# Patient Record
Sex: Male | Born: 1940 | Race: White | Hispanic: No | Marital: Married | Smoking: Former smoker
Health system: Southern US, Community
[De-identification: ages and names within clinical notes are randomized; demographics above are authoritative.]

## PROBLEM LIST (undated history)

## (undated) DIAGNOSIS — E785 Hyperlipidemia, unspecified: Secondary | ICD-10-CM

## (undated) DIAGNOSIS — I519 Heart disease, unspecified: Secondary | ICD-10-CM

## (undated) DIAGNOSIS — N400 Enlarged prostate without lower urinary tract symptoms: Secondary | ICD-10-CM

## (undated) DIAGNOSIS — I10 Essential (primary) hypertension: Secondary | ICD-10-CM

## (undated) DIAGNOSIS — I4891 Unspecified atrial fibrillation: Secondary | ICD-10-CM

## (undated) DIAGNOSIS — I2510 Atherosclerotic heart disease of native coronary artery without angina pectoris: Secondary | ICD-10-CM

## (undated) DIAGNOSIS — F32A Depression, unspecified: Secondary | ICD-10-CM

## (undated) DIAGNOSIS — I5022 Chronic systolic (congestive) heart failure: Secondary | ICD-10-CM

## (undated) DIAGNOSIS — I252 Old myocardial infarction: Secondary | ICD-10-CM

## (undated) DIAGNOSIS — J189 Pneumonia, unspecified organism: Secondary | ICD-10-CM

## (undated) DIAGNOSIS — F329 Major depressive disorder, single episode, unspecified: Secondary | ICD-10-CM

## (undated) HISTORY — DX: Pneumonia, unspecified organism: J18.9

## (undated) HISTORY — DX: Unspecified atrial fibrillation: I48.91

## (undated) HISTORY — DX: Old myocardial infarction: I25.2

## (undated) HISTORY — DX: Chronic systolic (congestive) heart failure: I50.22

## (undated) HISTORY — DX: Atherosclerotic heart disease of native coronary artery without angina pectoris: I25.10

## (undated) HISTORY — DX: Benign prostatic hyperplasia without lower urinary tract symptoms: N40.0

## (undated) HISTORY — DX: Depression, unspecified: F32.A

## (undated) HISTORY — DX: Hyperlipidemia, unspecified: E78.5

## (undated) HISTORY — DX: Essential (primary) hypertension: I10

## (undated) HISTORY — DX: Heart disease, unspecified: I51.9

## (undated) HISTORY — DX: Major depressive disorder, single episode, unspecified: F32.9

---

## 1999-06-26 ENCOUNTER — Ambulatory Visit (HOSPITAL_COMMUNITY): Admission: RE | Admit: 1999-06-26 | Discharge: 1999-06-26 | Payer: Self-pay | Admitting: Cardiology

## 1999-06-26 HISTORY — PX: CARDIAC CATHETERIZATION: SHX172

## 2007-07-27 HISTORY — PX: CORONARY ARTERY BYPASS GRAFT: SHX141

## 2007-10-17 HISTORY — PX: CARDIOVASCULAR STRESS TEST: SHX262

## 2007-10-24 HISTORY — PX: US ECHOCARDIOGRAPHY: HXRAD669

## 2007-10-31 ENCOUNTER — Ambulatory Visit (HOSPITAL_COMMUNITY): Admission: RE | Admit: 2007-10-31 | Discharge: 2007-10-31 | Payer: Self-pay | Admitting: Cardiology

## 2007-10-31 HISTORY — PX: CARDIAC CATHETERIZATION: SHX172

## 2007-11-20 ENCOUNTER — Ambulatory Visit: Payer: Self-pay | Admitting: Thoracic Surgery (Cardiothoracic Vascular Surgery)

## 2007-11-27 ENCOUNTER — Ambulatory Visit: Payer: Self-pay | Admitting: Vascular Surgery

## 2007-11-27 ENCOUNTER — Ambulatory Visit (HOSPITAL_COMMUNITY)
Admission: RE | Admit: 2007-11-27 | Discharge: 2007-11-27 | Payer: Self-pay | Admitting: Thoracic Surgery (Cardiothoracic Vascular Surgery)

## 2007-11-29 ENCOUNTER — Encounter: Payer: Self-pay | Admitting: Thoracic Surgery (Cardiothoracic Vascular Surgery)

## 2007-11-29 ENCOUNTER — Ambulatory Visit: Payer: Self-pay | Admitting: Thoracic Surgery (Cardiothoracic Vascular Surgery)

## 2007-11-29 ENCOUNTER — Inpatient Hospital Stay (HOSPITAL_COMMUNITY)
Admission: RE | Admit: 2007-11-29 | Discharge: 2007-12-05 | Payer: Self-pay | Admitting: Thoracic Surgery (Cardiothoracic Vascular Surgery)

## 2007-12-11 ENCOUNTER — Ambulatory Visit: Payer: Self-pay | Admitting: Thoracic Surgery (Cardiothoracic Vascular Surgery)

## 2008-01-01 ENCOUNTER — Encounter
Admission: RE | Admit: 2008-01-01 | Discharge: 2008-01-01 | Payer: Self-pay | Admitting: Thoracic Surgery (Cardiothoracic Vascular Surgery)

## 2008-01-01 ENCOUNTER — Ambulatory Visit: Payer: Self-pay | Admitting: Thoracic Surgery (Cardiothoracic Vascular Surgery)

## 2008-07-02 HISTORY — PX: US ECHOCARDIOGRAPHY: HXRAD669

## 2008-10-28 ENCOUNTER — Encounter: Admission: RE | Admit: 2008-10-28 | Discharge: 2008-10-28 | Payer: Self-pay | Admitting: Family Medicine

## 2008-11-08 ENCOUNTER — Encounter: Admission: RE | Admit: 2008-11-08 | Discharge: 2008-11-08 | Payer: Self-pay | Admitting: Family Medicine

## 2010-06-29 ENCOUNTER — Ambulatory Visit: Payer: Self-pay | Admitting: Cardiology

## 2010-07-03 ENCOUNTER — Ambulatory Visit: Payer: Self-pay | Admitting: Cardiology

## 2010-12-08 ENCOUNTER — Telehealth: Payer: Self-pay | Admitting: Cardiology

## 2010-12-08 NOTE — Telephone Encounter (Signed)
V A PUT HIM ON NEURTON AND ARICEPT. CAN THIS WITH HIS OTHER MEDS  CAUSE MEMORY LOSS.  PATIENT AND WIFE WANTS TO SEE IF HE SHOULD EVEN TAKE IT.  HAS A FAMILY HISTORY OF SEIZURES. CAN ARICEPT CAUSE THIS.

## 2010-12-08 NOTE — Telephone Encounter (Signed)
Wife called stating that he is now on Neurton for anxiety. Is having some memory problems. Put on by [facility] doctor. [facility] wants [facility] Aricept. Wants Dr.Jordan to tell them if OK to start with all of his other meds. Marc Hicks has been diagnosed w/ PTSD. He is to have CT scan; not sch yet. She is afraid to start another medication. Will check w/ Dr. Swaziland Friday when he is in office.

## 2010-12-08 NOTE — Discharge Summary (Signed)
[name], Hicks NO.:  [id number]   MEDICAL RECORD NO.:  [id number]          PATIENT TYPE:  INP   LOCATION:  2027                         FACILITY:  [facility]   PHYSICIAN:  Salvatore Decent. Cornelius Moras, M.D. DATE OF BIRTH:  [birth year]   DATE OF ADMISSION:  11/29/2007  DATE OF DISCHARGE:  12/04/2007                               DISCHARGE SUMMARY   PRIMARY ADMITTING DIAGNOSIS:  Severe three-vessel coronary artery  disease.   ADDITIONAL/DISCHARGE DIAGNOSES:  1. Severe three-vessel coronary artery disease.  2. Status post myocardial infarction in 1994.  3. Hypertension.  4. Hyperlipidemia.  5. Type 2 diabetes mellitus.  6. Benign prostatic hypertrophy.  7. Remote history of tobacco abuse.  8. Postoperative atrial fibrillation.  9. Postoperative acute blood loss anemia.   PROCEDURES PERFORMED:  1. Coronary artery bypass grafting x3 (left internal mammary artery to      the distal LAD, saphenous vein graft to the circumflex marginal,      saphenous vein graft to the posterior descending coronary).  2. Left LAD endarterectomy.  3. Endoscopic vein harvest, right thigh.   HISTORY:  The patient is a 70 year old male with a known history of  coronary artery disease.  He is status post previous myocardial  infarction in 1994, which was treated with angioplasties of the LAD and  a left circumflex at that time.  He has done well until recently, at  which point he developed worsening symptoms of exertional shortness of  breath and fatigue.  He underwent a stress Cardiolite exam which was  abnormal, thus prompting cardiac catheterization.  He underwent elective  catheterization by Dr. Swaziland on October 31, 2007, and was found to have  severe three-vessel coronary artery disease which was not felt to be  amenable to percutaneous intervention.  He was referred to Dr. Tressie Stalker for consideration of surgical revascularization.  Dr. Cornelius Moras reviewed  his films and felt that his best  course of action would be proceed with  CABG at this time.  He explained the risks, benefits, and alternatives  of the surgery to the patient and he agreed to proceed.   HOSPITAL COURSE:  Mr. Hicks was admitted to [facility] on Nov 29, 2007, and underwent CABG x3 and LAD endarterectomy as described in  detail above.  He tolerated the procedure well and was transferred to  the SICU in stable condition.  He was able to be extubated shortly after  surgery.  He was hemodynamically stable and doing well on postop day #1.  His chest tubes and hemodynamic monitoring devices were removed and he  was able to be transferred to the Step-Down Unit.  Postoperatively, he  has done very well.  He did have one brief episode of atrial  fibrillation on postop day #3 and was started on IV amiodarone.  He  converted quickly to normal sinus rhythm and has remained in sinus since  that time.  He has subsequently been switched to p.o. amiodarone and is  tolerating this well.  He has been restarted on a beta-blocker and an  ACE inhibitor.  He has also been started on Plavix and low-dose aspirin.  Overall, he is doing well.  He has been volume overloaded and was  started on Lasix to which he is responding well.  He remains  approximately 4 kg above his preoperative weight with some mild lower  extremity edema on physical exam.  His incisions are all healing well.  He is ambulating in the hall for cardiac rehab phase 1 and is making  good progress.  He has had no further arrhythmias.  He is tolerating a  regular diet.  His most recent labs show hemoglobin 11.6, hematocrit  32.7, platelets 187, white count 12.2, sodium 137, potassium 4.1, BUN  27, creatinine 0.96.  It is felt that since he has remained stable and  is doing well, he may be discharged home at this time.   DISCHARGE MEDICATIONS ARE AS FOLLOWS:  1. Enteric-coated aspirin 81 mg daily.  2. Plavix 75 mg daily.  3. Coreg 12.5 mg b.i.d.  4.  Lisinopril 20 mg daily.  5. Amiodarone 200 mg b.i.d.  6. Ultram 50-100 mg q.4-6 h. p.r.n. for pain.  7. Lasix 40 mg daily x4 days.  8. Potassium 20 mEq daily x4 days.  9. Novolin 70/30 45 units q.a.m. and 50 units q.p.m.  10.Metformin 150 mg b.i.d.  11.Cod liver oil b.i.d.  12.Niaspan 750 mg 2 tablets nightly.  13.Multivitamin daily.  14.B complex vitamin daily.   ALLERGIES:  C 1000 mg daily.   DISCHARGE INSTRUCTIONS:  He is asked to refrain from driving, heavy  lifting, or strenuous activity.  He may continue ambulating daily and  using his incentive spirometer.  He may shower daily and clean his  incisions with soap and water.  He will continue a low-fat, low-sodium,  carbohydrate-modified, medium-calorie diet.   DISCHARGE FOLLOW-UP:  He will need to make an appointment to see Dr.  Swaziland in 2 weeks.  He will be called with an appointment to see Dr.  Cornelius Moras in 3 weeks.  He will be contacted by the [facility] office with this  appointment.  He will also have a chest x-ray at [facility]  prior to his appointment with Dr. Cornelius Moras.  In the interim, he is asked to  call if he experiences any problems or has questions.      Coral Ceo, P.A.      Salvatore Decent. Cornelius Moras, M.D.  Electronically Signed    GC/MEDQ  D:  12/04/2007  T:  12/04/2007  Job:  [id number]   cc:   Peter M. Swaziland, M.D.  Robert A. Nicholos Johns, M.D.
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
## 2010-12-08 NOTE — Op Note (Signed)
Marc Hicks, DEPASCALE NO.:  [id number]   MEDICAL RECORD NO.:  [id number]          PATIENT TYPE:  INP   LOCATION:  2309                         FACILITY:  [facility]   PHYSICIAN:  Salvatore Decent. Cornelius Moras, M.D. DATE OF BIRTH:  1941/05/21   DATE OF PROCEDURE:  11/29/2007  DATE OF DISCHARGE:                               OPERATIVE REPORT   PREOPERATIVE DIAGNOSIS:  Severe three-vessel coronary artery disease.   POSTOPERATIVE DIAGNOSIS:  Severe three-vessel coronary artery disease.   PROCEDURE:  Median sternotomy for coronary artery bypass grafting x3  with coronary artery endarterectomy (left internal mammary artery to  distal left anterior descending coronary artery with endarterectomy,  saphenous vein graft to circumflex marginal branch, saphenous vein graft  to posterior descending coronary artery, and endoscopic saphenous vein  harvest from right thigh).   SURGEON:  Salvatore Decent. Cornelius Moras, MD   ASSISTANT:  Rowe Clack, [facility]   ANESTHESIA:  General.   BRIEF CLINICAL NOTE:  The patient is a 70 year old male with known  history of coronary artery disease, hypertension, hyperlipidemia, and  type 2 diabetes mellitus.  The patient suffered an acute anterior wall  myocardial infarction in 1994.  He was treated with emergency  angioplasty of the left anterior descending coronary artery at that  time.  He subsequently underwent angioplasty of the left circumflex  coronary artery later that week.  Since then, he has done well until  recently.  The patient now presents with worsening symptoms of  exertional shortness of breath and fatigue.  Stress Cardiolite exam was  abnormal, prompting cardiac catheterization.  This was performed by Dr.  Swaziland on October 31, 2007.  Findings were notable for the presence of  severe three-vessel coronary artery disease.  A full consultation note  has been dictated previously.   The patient and his wife have been counseled regarding the indications,  risks, and potential benefits of surgery.  Alternative treatment  strategies have been discussed.  They understand and accept all  associated risks and desire to proceed with surgery as described.   OPERATIVE FINDINGS:  1. Moderate left ventricular dysfunction.  2. Good-quality left internal mammary artery and saphenous vein      conduit for grafting.  3. Diffuse coronary artery disease.  4. Left anterior descending coronary artery very diffusely diseased,      requiring coronary artery endarterectomy.  5. Dense adhesions around the anterior wall and apex of the heart      consistent with remote history of transmural myocardial infarction.   OPERATIVE NOTE DETAIL:  The patient was brought to the operating room on  the above-mentioned date and central monitoring was established by the  Anesthesia Service under the care and direction of Dr. Arta Bruce.  Specifically, a Swan-Ganz catheter was placed through the right internal  jugular approach.  A radial arterial line was placed.  Intravenous  antibiotics were administered.  Following induction with general  endotracheal anesthesia, a Foley catheter was placed.  The patient's  chest, abdomen, both groins, and both lower extremities were prepared  and draped in sterile manner.  Baseline transesophageal echocardiogram  was performed by Dr. Michelle Piper.  This demonstrates moderate left ventricular  dysfunction.  There was trivial mitral regurgitation.  No other  abnormalities were noted.   Median sternotomy incision was performed and left internal mammary  artery was dissected from the chest wall and prepared for bypass  grafting.  Left internal mammary artery was good-quality conduit.  Simultaneously, saphenous vein was obtained from the patient's right  thigh using endoscopic vein harvest technique.  The saphenous vein was  slightly large caliber but otherwise good-quality conduit.  The patient  was heparinized systemically.  After the  saphenous vein has been removed  from the left lower extremity,  the small incisions were closed with  running absorbable suture.  The left internal mammary artery was  transected distally and noted to have adequate flow.   The pericardium was opened.  The heart was quite enlarged and the aorta  was somewhat foreshortened.  The ascending aorta was otherwise normal in  appearance.  The ascending aorta and the right atrium were cannulated  for cardiopulmonary bypass.  Cardiopulmonary bypass was begun.  There  were dense adhesions between the visceral and parietal surface of the  pericardium in the vicinity of the distal anterior wall and apex of the  heart.  These were divided using sharp dissection to mobilize the heart  completely.  The epicardial surface of the heart has a thick layer of  fat and all coronary arteries are intramyocardial.  A temperature probe  was placed in the left ventricular septum and a cardioplegic catheter  was placed in the ascending aorta.   The patient was allowed to cool passively to 32 degrees systemic  temperature.  The aortic crossclamp was applied and cold blood  cardioplegia was administered initially in the antegrade fashion through  the aortic root.  Iced saline slush was applied for topical hypothermia.  The initial cardioplegic arrest and myocardial cooling was felt to be  excellent.  Repeat doses of cardioplegia were administered  intermittently throughout the crossclamp portion of the operation  through the aortic root and down subsequently placed vein graft to  maintain left ventricular septal temperature below 15 degrees  centigrade.   The following distal coronary anastomoses were performed:  1. The posterior descending coronary artery was grafted with a      saphenous vein graft in end-to-side fashion.  This vessel measured      1.5 mm in diameter and is a fair-to-good quality target vessel.  2. The circumflex marginal branch was grafted with  a saphenous vein      graft in end-to-side fashion.  This vessel measures 2.0 mm in      diameter and is a fair-to-good quality target vessel for grafting.  3. The distal left anterior descending coronary artery was grafted      with left internal mammary artery in end-to-side fashion.  This      vessel was diffusely diseased and a poor quality target vessel for      grafting.  Endarterectomy of the coronary artery is required to      facilitate construction of the distal anastomoses.  Endarterectomy      was performed using the standard eversion technique and only a      short segment was required along the distal end of the vessel  and      the endarterectomy specimen feathers nicely.   Both proximal saphenous vein anastomoses were performed directly to the  ascending aorta prior  to removal of the aortic crossclamp.  Left  ventricular septal temperature rises appropriately following reperfusion  of the left internal mammary artery.  The aortic crossclamp was removed  after a total crossclamp time of 81 minutes.  The heart began to beat  spontaneously without need for cardioversion.  All proximal and distal  coronary anastomoses were inspected for hemostasis and appropriate graft  orientation.  Epicardial pacing wires were fixed to the right  ventricular outflow tract into the right atrial appendage.  The patient  was rewarmed to 37 degrees centigrade temperature.  The patient weaned  from cardiopulmonary bypass without difficulty.  The patient's rhythm at  separation from bypass is slow junctional escape rhythm.  AV sequential  pacing is employed.  Total cardiopulmonary bypass time to the operation  99 minutes.  The patient weaned from bypass on dopamine at 3  mcg/kg/minute.  Followup transesophageal echocardiogram performed by Dr.  Michelle Piper after separation from bypass, demonstrates no significant changes  from preoperatively.   The venous and arterial cannulae were removed  uneventfully.  Protamine  was administered to reverse the anticoagulation.  Mediastinum and left  chest are irrigated with saline solution containing vancomycin.  Meticulous surgical hemostasis ascertained.  The mediastinum and left  chest are drained using three chest tubes exited through separate stab  incisions inferiorly.  Pericardium and soft tissues anterior to the  aorta were reapproximated loosely.  The sternum was closed with double-  strength sternal wire.  The soft tissues anterior to the sternum were  closed in multiple layers and the skin was closed with a running  subcuticular skin closure.   The patient tolerated the procedure well and was transported to the  surgical intensive care unit in stable condition.  There were no  intraoperative complications.  All sponge, instrument, and needle counts  were verified, correct at the completion of the operation.  No blood  products were administered.      Salvatore Decent. Cornelius Moras, M.D.  Electronically Signed     CHO/MEDQ  D:  11/29/2007  T:  11/30/2007  Job:  [id number]   cc:   Peter M. Swaziland, M.D.  Robert A. Nicholos Johns, M.D.  Dr. Andee Lineman
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
## 2010-12-08 NOTE — Assessment & Plan Note (Signed)
OFFICE VISIT   TRASK, VOSLER  DOB:  1940/12/20                                        Dec 11, 2007  CHART #:  [id number]   HISTORY OF PRESENT ILLNESS:  Mr. Pellegrin returns to the office as an add-  on visit following recent coronary artery bypass grafting.  He was  discharged from hospital last week.  Since then, he has been doing quite  well.  However, his wife called the office on Friday and again this  morning due to concern regarding some oozing from an IV site on his  right forearm.  He has not had any fevers or chills.  He remains on  Plavix.  He has had some bloody drainage from the right forearm IV site.  They are concerned about his chest tube incisions.  Appetite is good.  He has had minimal pain.  He denies any shortness of breath.  His  activity level is slowly improving.  His weight has continued to  gradually trend down and is now below his baseline preoperative weight.  Overall, he feels well.  Medications remain unchanged from the time of  hospital discharge.   PHYSICAL EXAMINATION:  Was notable for well-appearing male.  Blood  pressure 133/70, pulse 76, oxygen saturation 98% on room air.  He is  afebrile.  His sternal incision is healing nicely.  The sternum is  stable on palpation.  Breath sounds are clear to auscultation and  symmetrical bilaterally.  The chest tube incisions are clean, and  although two of the skin edges are somewhat open, the wounds are healing  appropriately, and there is no sign of significant infection.  The right  lower extremity endoscopic vein harvest incision is healing nicely.  There is no lower extremity edema.  There is an old IV site in the right  forearm.  There is no surrounding erythema or cellulitis.  This  apparently has been oozing.  The patient remains on Plavix.   IMPRESSION:  Mr. Wingert appears to be doing well.   PLAN:  I have advised Mr. Schueller to keep the dressing applied snugly to  the  right forearm vein site, and to keep the arm propped up in the air  as much as possible to keep the pressure off it.  He will otherwise  continue with his routine postoperative convalescence as previously  instructed.  We have not made any changes to his current medications.  We will plan  to see him back for further followup in 3 weeks.   Salvatore Decent. Cornelius Moras, M.D.  Electronically Signed   CHO/MEDQ  D:  12/11/2007  T:  12/11/2007  Job:  [id number]   cc:   Peter M. [place]aziland, M.D.  Robert A. Nicholos Johns, M.D.

## 2010-12-08 NOTE — Assessment & Plan Note (Signed)
OFFICE VISIT   ORSON, RHO  DOB:  1941/03/07                                        January 01, 2008  CHART #:  [id number]   HISTORY OF PRESENT ILLNESS:  Mr. Dikes returns for followup, status  post coronary artery bypass grafting x3 on Nov 29, 2007.  Findings at the  time of surgery were notable for diffuse coronary artery disease,  requiring coronary endarterectomy for his left anterior descending  coronary artery.  Postoperative recovery was relatively unremarkable,  although the patient did have transient episode of postoperative atrial  fibrillation for which he was treated with amiodarone.  He was  discharged home in normal sinus rhythm.  He was kept on Plavix at least  for the short term due to the need for coronary endarterectomy at the  time of surgery.  Since hospital discharge, Mr. Flessner has continued to  do well.  He was last seen here in the office on Dec 11, 2007.  Since  then, he has had no further problems or complaints.  He has been seen in  followup by Dr. Peter Swaziland, and his dose of amiodarone was cut back to  200 mg daily.  On return to our office today, Mr. Macqueen reports  feeling well.  He has minimal residual soreness in his chest.  He has no  shortness of breath.  He denies any exertional chest tightness.  His  activity level continues to improve.  He has already started driving an  automobile on his own.  He is walking every day at [facility] and trying to  increase his activity.  He has decided not to take part in the cardiac  rehab program due to the need for an associated co-pay with his  insurance.  Overall, he has no complaints.   PHYSICAL EXAMINATION:  Notable for a well-appearing male with blood  pressure 115/66, pulse 72, and oxygen saturation 98% on room air.  CHEST:  Notable for median sternotomy incision that is healed nicely.  The sternum is stable on palpation.  Breath sounds are clear to  auscultation and symmetrical  bilaterally.  No wheezes or rhonchi noted.  CARDIOVASCULAR:  Regular rate and rhythm.  No murmurs, rubs, or gallops  are noted.  ABDOMEN:  Soft and nontender.  EXTREMITIES:  Warm and well-perfused.  The small incision from  endoscopic vein harvest on the right lower leg is healing well.  There  is no lower extremity edema.  The remainder of his physical exam is  unrevealing.   DIAGNOSTIC TEST:  Chest x-ray obtained today at the [facility] is reviewed.  This demonstrates clear lung fields bilaterally.  There are no pleural effusions.  All the sternal wires appear intact.   IMPRESSION:  Mr. Selvidge appears to be progressing quite well.   PLAN:  I have encouraged Mr. Candelaria to continue gradually  to increase  his physical activity as tolerated with his only limitation at this  point remaining that he refrain from heavy lifting or strenuous use of  his arms or shoulders for at least another 2 months.  I have encouraged  him to start working on his own physical rehab, as he has decided not to  take part in formal cardiac rehab program.  I have reminded him to  refrain from any heavy  lifting or strenuous use of his arms or shoulders  for at least another 2 months.  I do think it is reasonable for him to  resume driving an automobile.  I suggested that he could probably stop  amiodarone completely when his current prescription expires.  He is  concerned about the cost associated with Plavix therapy, and I have  suggested that perhaps after 3 months, he could cut back to full  strength aspirin daily rather than staying on Plavix.  I would like him  to stay on Plavix for the first 3 months due to his coronary  endarterectomy.  All of his questions have been addressed.  In the  future, Mr. Molesworth will call and return to see [place] here at [facility] only if further problems or difficulties arise.   Salvatore Decent. Cornelius Moras, M.D.  Electronically Signed    CHO/MEDQ  D:  01/01/2008  T:  01/02/2008  Job:  [id number]   cc:   Peter M. [place], M.D.  Robert A. Nicholos Johns, M.D.  Raye Sorrow, DO

## 2010-12-11 ENCOUNTER — Telehealth: Payer: Self-pay | Admitting: *Deleted

## 2010-12-11 NOTE — Telephone Encounter (Signed)
Per Dr.Jordan advised that from cardiac standpoint would be OK to start Aricept. Advised wife.

## 2010-12-11 NOTE — Cardiovascular Report (Signed)
Marc Hicks, GEER NO.:  [id number]   MEDICAL RECORD NO.:  [id number]          PATIENT TYPE:  OIB   LOCATION:  2858                         FACILITY:  [facility]   PHYSICIAN:  Peter M. Swaziland, M.D.  DATE OF BIRTH:  1941/02/26   DATE OF PROCEDURE:  DATE OF DISCHARGE:  10/31/2007                            CARDIAC CATHETERIZATION   INDICATIONS FOR PROCEDURE:  The patient is a 70 year old white male with  history of diabetes mellitus and known history of coronary disease.  He  is status post acute anterior myocardial infarction in 1994.  He  presents with symptoms of fatigue and dyspnea.  His recent stress  Cardiolite study showed a significant reduction in his exercise  tolerance and ejection fraction.  He had a fixed anterior and apical  defect.   PROCEDURES:  1. Left heart catheterization.  2. Coronary and left ventricular angiography.   EQUIPMENTS USED:  A 5-French 4 cm left Judkins catheter, 5-French 4 cm  right Judkins catheter, 5-French pigtail catheter, and 5-French arterial  sheath.   MEDICATIONS:  1. Local anesthesia with 1% lidocaine.  2. Contrast 100 mL of Omnipaque.   HEMODYNAMIC DATA:  Aortic pressure was 128/58 with a mean of 87 mmHg,  left ventricle pressure was 124 with EDP of 16 mmHg.   ANGIOGRAPHIC DATA:  Left coronary artery arises and distributes  normally.  The left main coronary artery appears normal.   The left anterior descending artery has a 50% ostial stenosis.  There is  a diffuse 30% narrowing in the proximal vessel and the mid vessel.  At  the takeoff of the first diagonal branch, there is a bifurcation  stenosis of 90%.  Following this in the mid vessel, there is a segment  of diffuse disease up to 70%-80%.   There is a modest-sized intermediate branch which has a 50%-60% stenosis  at its origin.   The left circumflex coronary artery has 30%-40% narrowing proximally.  In the midvessel, there is an eccentric 40%-50% stenosis  before large  marginal vessel.   The right coronary artery arises and distributes normally.  It is a  dominant vessel.  There is a 70%-80% stenosis proximally.  There is then  an eccentric 50%-60% stenosis in the midvessel.  There is 30% disease  prior to the PDA.   Left ventricular angiography was performed in the RAO view.  This  demonstrates severe hypokinesia of the mid-to-distal anterior wall and  the apex is akinetic.  Overall, left ventricular systolic function is  moderately reduced with ejection fraction estimated at 40%-45%.  There  is mild mitral insufficiency noted.   FINAL INTERPRETATION:  1. Three-vessel obstructive atherosclerotic coronary artery disease.  2. Moderate left ventricle dysfunction.           ______________________________  Peter M. Swaziland, M.D.     PMJ/MEDQ  D:  11/01/2007  T:  11/02/2007  Job:  [id number]

## 2011-03-18 ENCOUNTER — Encounter: Payer: Self-pay | Admitting: Cardiology

## 2011-03-22 ENCOUNTER — Encounter: Payer: Self-pay | Admitting: Cardiology

## 2011-04-02 ENCOUNTER — Encounter: Payer: Self-pay | Admitting: Cardiology

## 2011-04-02 ENCOUNTER — Ambulatory Visit (INDEPENDENT_AMBULATORY_CARE_PROVIDER_SITE_OTHER): Payer: Medicare Other | Admitting: Cardiology

## 2011-04-02 DIAGNOSIS — E785 Hyperlipidemia, unspecified: Secondary | ICD-10-CM

## 2011-04-02 DIAGNOSIS — E119 Type 2 diabetes mellitus without complications: Secondary | ICD-10-CM

## 2011-04-02 DIAGNOSIS — I10 Essential (primary) hypertension: Secondary | ICD-10-CM

## 2011-04-02 DIAGNOSIS — I519 Heart disease, unspecified: Secondary | ICD-10-CM

## 2011-04-02 DIAGNOSIS — I252 Old myocardial infarction: Secondary | ICD-10-CM

## 2011-04-02 DIAGNOSIS — I2510 Atherosclerotic heart disease of native coronary artery without angina pectoris: Secondary | ICD-10-CM

## 2011-04-02 NOTE — Patient Instructions (Addendum)
You need to lose weight!   Increase your aerobic activity.  Continue your current medications.  I will see you again in 6 months.

## 2011-04-03 DIAGNOSIS — I252 Old myocardial infarction: Secondary | ICD-10-CM | POA: Insufficient documentation

## 2011-04-03 DIAGNOSIS — I10 Essential (primary) hypertension: Secondary | ICD-10-CM | POA: Insufficient documentation

## 2011-04-03 DIAGNOSIS — I2510 Atherosclerotic heart disease of native coronary artery without angina pectoris: Secondary | ICD-10-CM | POA: Insufficient documentation

## 2011-04-03 DIAGNOSIS — Z794 Long term (current) use of insulin: Secondary | ICD-10-CM | POA: Insufficient documentation

## 2011-04-03 DIAGNOSIS — E119 Type 2 diabetes mellitus without complications: Secondary | ICD-10-CM | POA: Insufficient documentation

## 2011-04-03 DIAGNOSIS — E785 Hyperlipidemia, unspecified: Secondary | ICD-10-CM | POA: Insufficient documentation

## 2011-04-03 DIAGNOSIS — I519 Heart disease, unspecified: Secondary | ICD-10-CM | POA: Insufficient documentation

## 2011-04-03 NOTE — Assessment & Plan Note (Signed)
He is having no significant anginal symptoms. He is 3 years out from his bypass surgery. I would anticipate a followup stress test at your 5 unless he develops symptoms. I stressed the importance of lifestyle modifications with increased aerobic activity and dietary modification.

## 2011-04-03 NOTE — Assessment & Plan Note (Signed)
He is intolerant to multiple lipid-lowering agents. Dietary modification will be crucial.

## 2011-04-03 NOTE — Assessment & Plan Note (Signed)
Blood pressure is mildly elevated for his goal of 130 systolic. I think he needs to make significant improvements in his diet and aerobic activity. I think lifestyle modification would go a long way to helping his situation but he appears to be poorly motivated.
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
## 2011-04-03 NOTE — Assessment & Plan Note (Signed)
He has moderate left ventricle dysfunction. He has some slight edema today. Have instructed him on the importance of sodium restriction. We will continue with his ACE inhibitor and carvedilol.

## 2011-04-20 LAB — APTT: aPTT: 46 — ABNORMAL HIGH

## 2011-10-07 ENCOUNTER — Ambulatory Visit (INDEPENDENT_AMBULATORY_CARE_PROVIDER_SITE_OTHER): Payer: Medicare Other | Admitting: Cardiology

## 2011-10-07 ENCOUNTER — Encounter: Payer: Self-pay | Admitting: Cardiology

## 2011-10-07 VITALS — BP 140/68 | HR 85 | Ht 72.0 in | Wt 255.0 lb

## 2011-10-07 DIAGNOSIS — I519 Heart disease, unspecified: Secondary | ICD-10-CM

## 2011-10-07 DIAGNOSIS — E7800 Pure hypercholesterolemia, unspecified: Secondary | ICD-10-CM

## 2011-10-07 DIAGNOSIS — E785 Hyperlipidemia, unspecified: Secondary | ICD-10-CM

## 2011-10-07 DIAGNOSIS — I10 Essential (primary) hypertension: Secondary | ICD-10-CM

## 2011-10-07 DIAGNOSIS — I2510 Atherosclerotic heart disease of native coronary artery without angina pectoris: Secondary | ICD-10-CM

## 2011-10-07 NOTE — Assessment & Plan Note (Signed)
He is on chronic therapy with an ACE inhibitor and carvedilol. He appears to be euvolemic today.

## 2011-10-07 NOTE — Patient Instructions (Signed)
Continue your current medication  Keep up the weight loss.  Exercise more.  I will see you again in 6 months.

## 2011-10-07 NOTE — Assessment & Plan Note (Signed)
He has no significant anginal symptoms. I encouraged him to increase his aerobic activity. We will continue with risk factor modification.

## 2011-10-07 NOTE — Assessment & Plan Note (Signed)
He is intolerant to multiple lipid-lowering agents. He is currently taking fish oil. He needs to focus on weight loss.
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
## 2011-10-07 NOTE — Assessment & Plan Note (Signed)
Blood pressure control is acceptable. 

## 2013-02-22 ENCOUNTER — Encounter: Payer: Self-pay | Admitting: Cardiology

## 2013-02-23 ENCOUNTER — Encounter: Payer: Self-pay | Admitting: Cardiology

## 2015-04-09 ENCOUNTER — Ambulatory Visit (HOSPITAL_COMMUNITY)
Admission: RE | Admit: 2015-04-09 | Discharge: 2015-04-09 | Disposition: A | Discharge status: Home / Self Care | Payer: Medicare Other | Source: Ambulatory Visit | Attending: Nurse Practitioner | Admitting: Nurse Practitioner

## 2015-04-09 VITALS — BP 106/56 | HR 76 | Ht 72.0 in | Wt 250.8 lb

## 2015-04-09 DIAGNOSIS — I2510 Atherosclerotic heart disease of native coronary artery without angina pectoris: Secondary | ICD-10-CM | POA: Diagnosis not present

## 2015-04-09 DIAGNOSIS — I480 Paroxysmal atrial fibrillation: Secondary | ICD-10-CM | POA: Diagnosis present

## 2015-04-09 DIAGNOSIS — Z87890 Personal history of sex reassignment: Secondary | ICD-10-CM | POA: Diagnosis not present

## 2015-04-09 DIAGNOSIS — Z951 Presence of aortocoronary bypass graft: Secondary | ICD-10-CM | POA: Insufficient documentation

## 2015-04-09 NOTE — Patient Instructions (Signed)
Your physician has requested that you have an echocardiogram. Echocardiography is a painless test that uses sound waves to create images of your heart. It provides your doctor with information about the size and shape of your heart and how well your heart's chambers and valves are working. This procedure takes approximately one hour. There are no restrictions for this procedure.  Marc Hicks will call you regarding appointment for echo. Scheduler will be in touch with you regarding appt with Dr. Swaziland.  Your physician has recommended you make the following change in your medication:  1)Stop aspirin 2)may use tylenol for pain.  Parking code for October 0900

## 2015-04-10 ENCOUNTER — Encounter (HOSPITAL_COMMUNITY): Payer: Self-pay | Admitting: Nurse Practitioner

## 2015-04-10 ENCOUNTER — Inpatient Hospital Stay (HOSPITAL_COMMUNITY): Admission: RE | Admit: 2015-04-10 | Payer: Self-pay | Source: Ambulatory Visit | Admitting: Nurse Practitioner
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
## 2015-04-14 ENCOUNTER — Ambulatory Visit (HOSPITAL_COMMUNITY)
Admission: RE | Admit: 2015-04-14 | Discharge: 2015-04-14 | Disposition: A | Discharge status: Home / Self Care | Payer: Medicare Other | Source: Ambulatory Visit | Attending: Nurse Practitioner | Admitting: Nurse Practitioner

## 2015-04-14 DIAGNOSIS — E785 Hyperlipidemia, unspecified: Secondary | ICD-10-CM | POA: Insufficient documentation

## 2015-04-14 DIAGNOSIS — I10 Essential (primary) hypertension: Secondary | ICD-10-CM | POA: Diagnosis not present

## 2015-04-14 DIAGNOSIS — E119 Type 2 diabetes mellitus without complications: Secondary | ICD-10-CM | POA: Insufficient documentation

## 2015-04-14 DIAGNOSIS — I480 Paroxysmal atrial fibrillation: Secondary | ICD-10-CM

## 2015-04-14 DIAGNOSIS — I517 Cardiomegaly: Secondary | ICD-10-CM | POA: Insufficient documentation

## 2015-04-14 DIAGNOSIS — I4891 Unspecified atrial fibrillation: Secondary | ICD-10-CM | POA: Insufficient documentation

## 2015-04-14 NOTE — Progress Notes (Signed)
  Echocardiogram 2D Echocardiogram has been performed.  Wilford Merryfield 04/14/2015, 3:30 PM

## 2015-05-12 ENCOUNTER — Ambulatory Visit (HOSPITAL_COMMUNITY): Payer: Medicare Other | Admitting: Nurse Practitioner

## 2015-05-12 ENCOUNTER — Encounter (HOSPITAL_COMMUNITY): Payer: Self-pay | Admitting: Nurse Practitioner

## 2015-05-12 ENCOUNTER — Ambulatory Visit (HOSPITAL_COMMUNITY)
Admission: RE | Admit: 2015-05-12 | Discharge: 2015-05-12 | Disposition: A | Discharge status: Home / Self Care | Payer: Medicare Other | Source: Ambulatory Visit | Attending: Nurse Practitioner | Admitting: Nurse Practitioner

## 2015-05-12 VITALS — BP 134/64 | HR 80 | Ht 72.0 in | Wt 245.2 lb

## 2015-05-12 DIAGNOSIS — Z951 Presence of aortocoronary bypass graft: Secondary | ICD-10-CM | POA: Insufficient documentation

## 2015-05-12 DIAGNOSIS — I2510 Atherosclerotic heart disease of native coronary artery without angina pectoris: Secondary | ICD-10-CM | POA: Diagnosis not present

## 2015-05-12 DIAGNOSIS — I480 Paroxysmal atrial fibrillation: Secondary | ICD-10-CM
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
## 2015-07-31 ENCOUNTER — Ambulatory Visit
Admission: RE | Admit: 2015-07-31 | Discharge: 2015-07-31 | Disposition: A | Discharge status: Home / Self Care | Payer: Medicare Other | Source: Ambulatory Visit | Attending: Family Medicine | Admitting: Family Medicine

## 2015-07-31 ENCOUNTER — Other Ambulatory Visit: Payer: Self-pay | Admitting: Family Medicine

## 2015-07-31 DIAGNOSIS — M25552 Pain in left hip: Secondary | ICD-10-CM

## 2015-08-12 ENCOUNTER — Encounter: Payer: Self-pay | Admitting: Cardiology

## 2015-08-12 ENCOUNTER — Ambulatory Visit (INDEPENDENT_AMBULATORY_CARE_PROVIDER_SITE_OTHER): Payer: Medicare Other | Admitting: Cardiology

## 2015-08-12 VITALS — BP 158/60 | HR 68 | Ht 72.0 in | Wt 251.4 lb

## 2015-08-12 DIAGNOSIS — I519 Heart disease, unspecified: Secondary | ICD-10-CM

## 2015-08-12 DIAGNOSIS — E785 Hyperlipidemia, unspecified: Secondary | ICD-10-CM

## 2015-08-12 DIAGNOSIS — I480 Paroxysmal atrial fibrillation: Secondary | ICD-10-CM

## 2015-08-12 DIAGNOSIS — I252 Old myocardial infarction: Secondary | ICD-10-CM

## 2015-08-12 DIAGNOSIS — I5022 Chronic systolic (congestive) heart failure: Secondary | ICD-10-CM

## 2015-08-12 DIAGNOSIS — I10 Essential (primary) hypertension: Secondary | ICD-10-CM

## 2015-08-12 DIAGNOSIS — I4891 Unspecified atrial fibrillation: Secondary | ICD-10-CM | POA: Insufficient documentation

## 2015-08-12 DIAGNOSIS — I25810 Atherosclerosis of coronary artery bypass graft(s) without angina pectoris: Secondary | ICD-10-CM

## 2015-08-12 HISTORY — DX: Chronic systolic (congestive) heart failure: I50.22

## 2015-08-12 MED ORDER — CARVEDILOL 12.5 MG PO TABS: 12.5000 mg | ORAL_TABLET | Freq: Two times a day (BID) | ORAL | Status: AC

## 2015-08-12 NOTE — Patient Instructions (Signed)
Increase Carvedilol to 12.5 mg twice a day.  We will schedule you for a nuclear stress test  I will see you in 6 months.
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
## 2015-08-26 ENCOUNTER — Telehealth (HOSPITAL_COMMUNITY): Payer: Self-pay

## 2015-08-26 ENCOUNTER — Telehealth: Payer: Self-pay | Admitting: Cardiology

## 2015-08-26 NOTE — Telephone Encounter (Signed)
Encounter complete. 

## 2015-08-26 NOTE — Telephone Encounter (Signed)
Returned call to patient's daughter Marc Hicks.She wanted to know if father needs to keep appointment for stress test.Stated they just found out mother's cancer treatment has been stopped.Stated she now has liver cancer.Advised when he saw Dr.Jordan 08/12/15 he was complaining of having uncomfortable feeling in chest.Stated he continues to have that and also pain in shoulders.Advised he should keep appointment for stress test as planned.

## 2015-08-26 NOTE — Telephone Encounter (Signed)
Marc Hicks( daughter) is calling to speak to you about her father stress test , and postponing it for a while . Please call   Thanks

## 2015-08-28 ENCOUNTER — Ambulatory Visit (HOSPITAL_COMMUNITY)
Admission: RE | Admit: 2015-08-28 | Discharge: 2015-08-28 | Disposition: A | Discharge status: Home / Self Care | Payer: Medicare Other | Source: Ambulatory Visit | Attending: Cardiology | Admitting: Cardiology

## 2015-08-28 DIAGNOSIS — I517 Cardiomegaly: Secondary | ICD-10-CM | POA: Insufficient documentation

## 2015-08-28 DIAGNOSIS — I252 Old myocardial infarction: Secondary | ICD-10-CM | POA: Diagnosis not present

## 2015-08-28 DIAGNOSIS — I480 Paroxysmal atrial fibrillation: Secondary | ICD-10-CM | POA: Insufficient documentation

## 2015-08-28 DIAGNOSIS — E669 Obesity, unspecified: Secondary | ICD-10-CM | POA: Insufficient documentation

## 2015-08-28 DIAGNOSIS — E119 Type 2 diabetes mellitus without complications: Secondary | ICD-10-CM | POA: Diagnosis not present

## 2015-08-28 DIAGNOSIS — R002 Palpitations: Secondary | ICD-10-CM | POA: Insufficient documentation

## 2015-08-28 DIAGNOSIS — I25810 Atherosclerosis of coronary artery bypass graft(s) without angina pectoris: Secondary | ICD-10-CM

## 2015-08-28 DIAGNOSIS — R9439 Abnormal result of other cardiovascular function study: Secondary | ICD-10-CM | POA: Insufficient documentation

## 2015-08-28 DIAGNOSIS — Z6834 Body mass index (BMI) 34.0-34.9, adult: Secondary | ICD-10-CM | POA: Insufficient documentation

## 2015-08-28 DIAGNOSIS — I5022 Chronic systolic (congestive) heart failure: Secondary | ICD-10-CM

## 2015-08-28 DIAGNOSIS — I519 Heart disease, unspecified: Secondary | ICD-10-CM

## 2015-08-28 DIAGNOSIS — Z8249 Family history of ischemic heart disease and other diseases of the circulatory system: Secondary | ICD-10-CM | POA: Diagnosis not present

## 2015-08-28 DIAGNOSIS — R0609 Other forms of dyspnea: Secondary | ICD-10-CM | POA: Diagnosis not present

## 2015-08-28 DIAGNOSIS — E785 Hyperlipidemia, unspecified: Secondary | ICD-10-CM | POA: Diagnosis not present

## 2015-08-28 DIAGNOSIS — Z87891 Personal history of nicotine dependence: Secondary | ICD-10-CM | POA: Diagnosis not present

## 2015-08-28 DIAGNOSIS — I10 Essential (primary) hypertension: Secondary | ICD-10-CM | POA: Diagnosis not present

## 2015-08-28 LAB — MYOCARDIAL PERFUSION IMAGING
CHL CUP NUCLEAR SDS: 10
CHL CUP NUCLEAR SRS: 16
CHL CUP NUCLEAR SSS: 26
CHL CUP RESTING HR STRESS: 69 {beats}/min
LV sys vol: 126 mL
LVDIAVOL: 194 mL
Peak HR: 86 {beats}/min
TID: 1.13

## 2015-08-28 MED ORDER — REGADENOSON 0.4 MG/5ML IV SOLN
0.4000 mg | Freq: Once | INTRAVENOUS | Status: AC
  Administered 2015-08-28: 0.4 mg via INTRAVENOUS

## 2015-08-28 MED ORDER — TECHNETIUM TC 99M SESTAMIBI GENERIC - CARDIOLITE
30.8000 | Freq: Once | INTRAVENOUS | Status: AC | PRN
  Administered 2015-08-28: 30.8 via INTRAVENOUS

## 2015-08-28 MED ORDER — AMINOPHYLLINE 25 MG/ML IV SOLN
75.0000 mg | Freq: Once | INTRAVENOUS | Status: AC
  Administered 2015-08-28: 75 mg via INTRAVENOUS

## 2015-08-28 MED ORDER — TECHNETIUM TC 99M SESTAMIBI GENERIC - CARDIOLITE
10.4000 | Freq: Once | INTRAVENOUS | Status: AC | PRN
  Administered 2015-08-28: 10.4 via INTRAVENOUS

## 2015-12-24 ENCOUNTER — Telehealth: Payer: Self-pay | Admitting: Cardiology

## 2015-12-24 NOTE — Telephone Encounter (Signed)
Returned call to patent.He stated he was calling to schedule follow up appointment with Dr.Jordan.Appointment scheduled with Dr.Jordan 04/15/16 at 1:30 pm.

## 2015-12-24 NOTE — Telephone Encounter (Signed)
New message    Pt is calling for a sooner appt he dont want PA appt or to be put on waitlist
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
## 2016-04-15 ENCOUNTER — Encounter: Payer: Self-pay | Admitting: Cardiology

## 2016-04-15 ENCOUNTER — Ambulatory Visit (INDEPENDENT_AMBULATORY_CARE_PROVIDER_SITE_OTHER): Payer: Medicare Other | Admitting: Cardiology

## 2016-04-15 VITALS — BP 170/60 | HR 76 | Ht 72.0 in | Wt 263.2 lb

## 2016-04-15 DIAGNOSIS — E785 Hyperlipidemia, unspecified: Secondary | ICD-10-CM

## 2016-04-15 DIAGNOSIS — I252 Old myocardial infarction: Secondary | ICD-10-CM | POA: Diagnosis not present

## 2016-04-15 DIAGNOSIS — I480 Paroxysmal atrial fibrillation: Secondary | ICD-10-CM

## 2016-04-15 DIAGNOSIS — I25810 Atherosclerosis of coronary artery bypass graft(s) without angina pectoris: Secondary | ICD-10-CM | POA: Diagnosis not present

## 2016-04-15 DIAGNOSIS — I5022 Chronic systolic (congestive) heart failure: Secondary | ICD-10-CM | POA: Diagnosis not present

## 2016-04-15 DIAGNOSIS — I10 Essential (primary) hypertension: Secondary | ICD-10-CM

## 2016-04-15 NOTE — Patient Instructions (Addendum)
Continue your current therapy  We will have you see our Pharm D to consider some new medication for your weak heart  I encourage you to exercise more and lose weight.  I will follow up in 3 months.

## 2016-04-16 ENCOUNTER — Telehealth: Payer: Self-pay | Admitting: Cardiology

## 2016-04-16 ENCOUNTER — Other Ambulatory Visit: Payer: Self-pay

## 2016-04-16 DIAGNOSIS — E785 Hyperlipidemia, unspecified: Secondary | ICD-10-CM

## 2016-04-16 DIAGNOSIS — I5022 Chronic systolic (congestive) heart failure: Secondary | ICD-10-CM

## 2016-04-16 LAB — LIPID PANEL
CHOL/HDL RATIO: 5.4 ratio — AB (ref <=5.0)
CHOLESTEROL: 196 mg/dL (ref 125–200)
HDL: 36 mg/dL — ABNORMAL LOW (ref >=40)
Triglycerides: 484 mg/dL — ABNORMAL HIGH (ref <150)

## 2016-04-16 LAB — COMPREHENSIVE METABOLIC PANEL
ALK PHOS: 92 U/L (ref 40–115)
ALT: 25 U/L (ref 9–46)
AST: 24 U/L (ref 10–35)
Albumin: 3.7 g/dL (ref 3.6–5.1)
BILIRUBIN TOTAL: 0.6 mg/dL (ref 0.2–1.2)
BUN: 17 mg/dL (ref 7–25)
CO2: 23 mmol/L (ref 20–31)
Calcium: 9.2 mg/dL (ref 8.6–10.3)
Chloride: 98 mmol/L (ref 98–110)
Creat: 0.87 mg/dL (ref 0.70–1.18)
Glucose, Bld: 231 mg/dL — ABNORMAL HIGH (ref 65–99)
POTASSIUM: 4.4 mmol/L (ref 3.5–5.3)
Sodium: 133 mmol/L — ABNORMAL LOW (ref 135–146)
TOTAL PROTEIN: 6.2 g/dL (ref 6.1–8.1)

## 2016-04-16 LAB — LDL CHOLESTEROL, DIRECT: Direct LDL: 122 mg/dL (ref <130)

## 2016-04-16 MED ORDER — FISH OIL 1000 MG PO CAPS: ORAL_CAPSULE | ORAL | 0 refills | Status: AC

## 2016-04-16 MED ORDER — ROSUVASTATIN CALCIUM 5 MG PO TABS: 5.0000 mg | ORAL_TABLET | Freq: Every day | ORAL | 6 refills | Status: AC

## 2016-04-16 MED ORDER — SPIRONOLACTONE 25 MG PO TABS: 25.0000 mg | ORAL_TABLET | Freq: Every day | ORAL | 6 refills | Status: DC

## 2016-04-16 NOTE — Telephone Encounter (Signed)
Returned call to patient's daughter Amil AmenJulia lab results given and explained to her she stated father did not understand instructions this morning.Stated to call her next time he does not hear good.

## 2016-04-16 NOTE — Telephone Encounter (Signed)
New message    Pts dtr called stating that someone called her dad and he can not hear well with his hearing aid and she asks that you only call her. Pt does not know what was said during the conversation. Please call the dtr.

## 2016-04-16 NOTE — Telephone Encounter (Signed)
Returned call to patient's daughter Marc Hicks no answer.LMTC.

## 2016-04-20 ENCOUNTER — Telehealth: Payer: Self-pay | Admitting: *Deleted

## 2016-04-20 LAB — BASIC METABOLIC PANEL
BUN: 16 mg/dL (ref 7–25)
CALCIUM: 9.3 mg/dL (ref 8.6–10.3)
CO2: 24 mmol/L (ref 20–31)
CREATININE: 0.93 mg/dL (ref 0.70–1.18)
Chloride: 100 mmol/L (ref 98–110)
GLUCOSE: 247 mg/dL — AB (ref 65–99)
POTASSIUM: 4.8 mmol/L (ref 3.5–5.3)
Sodium: 134 mmol/L — ABNORMAL LOW (ref 135–146)

## 2016-04-20 NOTE — Telephone Encounter (Signed)
PATIENT AT LAB - FOR LAB DRAW  CALLING TO CONFIRM. SINCE PATIENT ATE @ 6 HOURS AGO. BMP,HEPATIC , LIPID PANEL. PATIENT'S FAMILY MEMBER STATES SHE WAS TOLD TO COME TODAY. REVIEWED PATIENT'S CHART- PATIENT WAS TO HAVE BMP PANEL TODAY ONLY AND COME BACK IN 3 MONTHS TO HAVE LIPID AND HEPATIC DONE AT THAT TIME  RIANNA STATES SHE WILL LET PATIENT AND FAMILY MEMBER KNOW

## 2016-04-21 ENCOUNTER — Other Ambulatory Visit: Payer: Self-pay

## 2016-04-21 ENCOUNTER — Telehealth: Payer: Self-pay | Admitting: Cardiology

## 2016-04-21 DIAGNOSIS — I5022 Chronic systolic (congestive) heart failure: Secondary | ICD-10-CM

## 2016-04-21 NOTE — Telephone Encounter (Signed)
New Message  Pt voiced she is returning cheryl's call.  Please f/u with pt

## 2016-04-21 NOTE — Telephone Encounter (Signed)
Returned call to patient's daughter Amil AmenJulia.Recent bmet results given.Advised to have repeated in 2 weeks lab order mailed.

## 2016-05-06 ENCOUNTER — Ambulatory Visit (INDEPENDENT_AMBULATORY_CARE_PROVIDER_SITE_OTHER): Payer: Medicare Other | Admitting: Pharmacist Clinician (PhC)/ Clinical Pharmacy Specialist

## 2016-05-06 VITALS — BP 146/56 | Ht 72.0 in

## 2016-05-06 DIAGNOSIS — I5022 Chronic systolic (congestive) heart failure: Secondary | ICD-10-CM | POA: Diagnosis not present

## 2016-05-06 LAB — BASIC METABOLIC PANEL
BUN: 16 mg/dL (ref 7–25)
CHLORIDE: 100 mmol/L (ref 98–110)
CO2: 25 mmol/L (ref 20–31)
CREATININE: 0.81 mg/dL (ref 0.70–1.18)
Calcium: 9.2 mg/dL (ref 8.6–10.3)
Glucose, Bld: 226 mg/dL — ABNORMAL HIGH (ref 65–99)
POTASSIUM: 5 mmol/L (ref 3.5–5.3)
Sodium: 134 mmol/L — ABNORMAL LOW (ref 135–146)
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
## 2016-05-06 NOTE — Patient Instructions (Addendum)
Stop lisinopril.    On Sunday start the Entresto 49/51 mg twice daily.  If you have any problems with this please call Kristin/Kelley at [phone].  Continue to check your home BP daily.  If you notice that it drops too low (you feel dizzy/washed out), please call.    HOW TO TAKE YOUR BLOOD PRESSURE: . Rest 5 minutes before taking your blood pressure. .  Don't smoke or drink caffeinated beverages for at least 30 minutes before. . Take your blood pressure before (not after) you eat. . Sit comfortably with your back supported and both feet on the floor (don't cross your legs). . Elevate your arm to heart level on a table or a desk. . Use the proper sized cuff. It should fit smoothly and snugly around your bare upper arm. There should be enough room to slip a fingertip under the cuff. The bottom edge of the cuff should be 1 inch above the crease of the elbow. . Ideally, take 3 measurements at one sitting and record the average.   Heart Failure Heart failure is a condition in which the heart has trouble pumping blood. This means your heart does not pump blood efficiently for your body to work well. In some cases of heart failure, fluid may back up into your lungs or you may have swelling (edema) in your lower legs. Heart failure is usually a long-term (chronic) condition. It is important for you to take good care of yourself and follow your health care provider's treatment plan. CAUSES  Some health conditions can cause heart failure. Those health conditions include:  High blood pressure (hypertension). Hypertension causes the heart muscle to work harder than normal. When pressure in the blood vessels is high, the heart needs to pump (contract) with more force in order to circulate blood throughout the body. High blood pressure eventually causes the heart to become stiff and weak.  Coronary artery disease (CAD). CAD is the buildup of cholesterol and fat (plaque) in the arteries of the heart. The  blockage in the arteries deprives the heart muscle of oxygen and blood. This can cause chest pain and may lead to a heart attack. High blood pressure can also contribute to CAD.  Heart attack (myocardial infarction). A heart attack occurs when one or more arteries in the heart become blocked. The loss of oxygen damages the muscle tissue of the heart. When this happens, part of the heart muscle dies. The injured tissue does not contract as well and weakens the heart's ability to pump blood.  Abnormal heart valves. When the heart valves do not open and close properly, it can cause heart failure. This makes the heart muscle pump harder to keep the blood flowing.  Heart muscle disease (cardiomyopathy or myocarditis). Heart muscle disease is damage to the heart muscle from a variety of causes. These can include drug or alcohol abuse, infections, or unknown reasons. These can increase the risk of heart failure.  Lung disease. Lung disease makes the heart work harder because the lungs do not work properly. This can cause a strain on the heart, leading it to fail.  Diabetes. Diabetes increases the risk of heart failure. High blood sugar contributes to high fat (lipid) levels in the blood. Diabetes can also cause slow damage to tiny blood vessels that carry important nutrients to the heart muscle. When the heart does not get enough oxygen and food, it can cause the heart to become weak and stiff. This leads to a heart that  does not contract efficiently.  Other conditions can contribute to heart failure. These include abnormal heart rhythms, thyroid problems, and low blood counts (anemia). Certain unhealthy behaviors can increase the risk of heart failure, including:  Being overweight.  Smoking or chewing tobacco.  Eating foods high in fat and cholesterol.  Abusing illicit drugs or alcohol.  Lacking physical activity. SYMPTOMS  Heart failure symptoms may vary and can be hard to detect. Symptoms may  include:  Shortness of breath with activity, such as climbing stairs.  Persistent cough.  Swelling of the feet, ankles, legs, or abdomen.  Unexplained weight gain.  Difficulty breathing when lying flat (orthopnea).  Waking from sleep because of the need to sit up and get more air.  Rapid heartbeat.  Fatigue and loss of energy.  Feeling light-headed, dizzy, or close to fainting.  Loss of appetite.  Nausea.  Increased urination during the night (nocturia). DIAGNOSIS  A diagnosis of heart failure is based on your history, symptoms, physical examination, and diagnostic tests. Diagnostic tests for heart failure may include:  Echocardiography.  Electrocardiography.  Chest X-ray.  Blood tests.  Exercise stress test.  Cardiac angiography.  Radionuclide scans. TREATMENT  Treatment is aimed at managing the symptoms of heart failure. Medicines, behavioral changes, or surgical intervention may be necessary to treat heart failure.  Medicines to help treat heart failure may include:  Angiotensin-converting enzyme (ACE) inhibitors. This type of medicine blocks the effects of a blood protein called angiotensin-converting enzyme. ACE inhibitors relax (dilate) the blood vessels and help lower blood pressure.  Angiotensin receptor blockers (ARBs). This type of medicine blocks the actions of a blood protein called angiotensin. Angiotensin receptor blockers dilate the blood vessels and help lower blood pressure.  Water pills (diuretics). Diuretics cause the kidneys to remove salt and water from the blood. The extra fluid is removed through urination. This loss of extra fluid lowers the volume of blood the heart pumps.  Beta blockers. These prevent the heart from beating too fast and improve heart muscle strength.  Digitalis. This increases the force of the heartbeat.  Healthy behavior changes include:  Obtaining and maintaining a healthy weight.  Stopping smoking or chewing  tobacco.  Eating heart-healthy foods.  Limiting or avoiding alcohol.  Stopping illicit drug use.  Physical activity as directed by your health care provider.  Surgical treatment for heart failure may include:  A procedure to open blocked arteries, repair damaged heart valves, or remove damaged heart muscle tissue.  A pacemaker to improve heart muscle function and control certain abnormal heart rhythms.  An internal cardioverter defibrillator to treat certain serious abnormal heart rhythms.  A left ventricular assist device (LVAD) to assist the pumping ability of the heart. HOME CARE INSTRUCTIONS   Take medicines only as directed by your health care provider. Medicines are important in reducing the workload of your heart, slowing the progression of heart failure, and improving your symptoms.  Do not stop taking your medicine unless directed by your health care provider.  Do not skip any dose of medicine.  Refill your prescriptions before you run out of medicine. Your medicines are needed every day.  Engage in moderate physical activity if directed by your health care provider. Moderate physical activity can benefit some people. The elderly and people with severe heart failure should consult with a health care provider for physical activity recommendations.  Eat heart-healthy foods. Food choices should be free of trans fat and low in saturated fat, cholesterol, and salt (sodium). Healthy  choices include fresh or frozen fruits and vegetables, fish, lean meats, legumes, fat-free or low-fat dairy products, and whole grain or high fiber foods. Talk to a dietitian to learn more about heart-healthy foods.  Limit sodium if directed by your health care provider. Sodium restriction may reduce symptoms of heart failure in some people. Talk to a dietitian to learn more about heart-healthy seasonings.  Use healthy cooking methods. Healthy cooking methods include roasting, grilling, broiling,  baking, poaching, steaming, or stir-frying. Talk to a dietitian to learn more about healthy cooking methods.  Limit fluids if directed by your health care provider. Fluid restriction may reduce symptoms of heart failure in some people.  Weigh yourself every day. Daily weights are important in the early recognition of excess fluid. You should weigh yourself every morning after you urinate and before you eat breakfast. Wear the same amount of clothing each time you weigh yourself. Record your daily weight. Provide your health care provider with your weight record.  Monitor and record your blood pressure if directed by your health care provider.  Check your pulse if directed by your health care provider.  Lose weight if directed by your health care provider. Weight loss may reduce symptoms of heart failure in some people.  Stop smoking or chewing tobacco. Nicotine makes your heart work harder by causing your blood vessels to constrict. Do not use nicotine gum or patches before talking to your health care provider.  Keep all follow-up visits as directed by your health care provider. This is important.  Limit alcohol intake to no more than 1 drink per day for nonpregnant women and 2 drinks per day for men. One drink equals 12 ounces of beer, 5 ounces of wine, or 1 ounces of hard liquor. Drinking more than that is harmful to your heart. Tell your health care provider if you drink alcohol several times a week. Talk with your health care provider about whether alcohol is safe for you. If your heart has already been damaged by alcohol or you have severe heart failure, drinking alcohol should be stopped completely.  Stop illicit drug use.  Stay up-to-date with immunizations. It is especially important to prevent respiratory infections through current pneumococcal and influenza immunizations.  Manage other health conditions such as hypertension, diabetes, thyroid disease, or abnormal heart rhythms as  directed by your health care provider.  Learn to manage stress.  Plan rest periods when fatigued.  Learn strategies to manage high temperatures. If the weather is extremely hot:  Avoid vigorous physical activity.  Use air conditioning or fans or seek a cooler location.  Avoid caffeine and alcohol.  Wear loose-fitting, lightweight, and light-colored clothing.  Learn strategies to manage cold temperatures. If the weather is extremely cold:  Avoid vigorous physical activity.  Layer clothes.  Wear mittens or gloves, a hat, and a scarf when going outside.  Avoid alcohol.  Obtain ongoing education and support as needed.  Participate in or seek rehabilitation as needed to maintain or improve independence and quality of life. SEEK MEDICAL CARE IF:   You have a rapid weight gain.  You have increasing shortness of breath that is unusual for you.  You are unable to participate in your usual physical activities.  You tire easily.  You cough more than normal, especially with physical activity.  You have any or more swelling in areas such as your hands, feet, ankles, or abdomen.  You are unable to sleep because it is hard to breathe.  You feel  like your heart is beating fast (palpitations).  You become dizzy or light-headed upon standing up. SEEK IMMEDIATE MEDICAL CARE IF:   You have difficulty breathing.  There is a change in mental status such as decreased alertness or difficulty with concentration.  You have a pain or discomfort in your chest.  You have an episode of fainting (syncope). MAKE SURE YOU:   Understand these instructions.  Will watch your condition.  Will get help right away if you are not doing well or get worse.   This information is not intended to replace advice given to you by your health care provider. Make sure you discuss any questions you have with your health care provider.   Document Released: 07/12/2005 Document Revised: 11/26/2014  Document Reviewed: 08/11/2012 Elsevier Interactive Patient Education [employer]

## 2016-05-07 ENCOUNTER — Encounter: Payer: Self-pay | Admitting: Pharmacist Clinician (PhC)/ Clinical Pharmacy Specialist

## 2016-05-07 MED ORDER — SACUBITRIL-VALSARTAN 49-51 MG PO TABS: 1.0000 | ORAL_TABLET | Freq: Two times a day (BID) | ORAL | 1 refills | Status: DC

## 2016-05-07 MED ORDER — SPIRONOLACTONE 25 MG PO TABS: 25.0000 mg | ORAL_TABLET | Freq: Every day | ORAL | 3 refills | Status: AC

## 2016-05-07 NOTE — Assessment & Plan Note (Signed)
Patient with low EF CHF now on spironolactone with a bump in potassium to 4.8.  Repeated this afternoon after appointment, went to 5.0.  Patient to stop lisinopril as of today and start on Entresto 49/51 mg bid on Sunday.  He is to return in 3 weeks for a follow up appointment.  Because of the bump in potassium I have contacted patient by phone and asked him to cut the spironolactone to 12.5 mg (1/2 tablet) for now.  Will repeat BMET in 2 weeks, several days prior to his next appointment.

## 2016-05-27 ENCOUNTER — Other Ambulatory Visit: Payer: Self-pay | Admitting: Cardiology

## 2016-05-27 ENCOUNTER — Ambulatory Visit (INDEPENDENT_AMBULATORY_CARE_PROVIDER_SITE_OTHER): Payer: Medicare Other | Admitting: Pharmacist Clinician (PhC)/ Clinical Pharmacy Specialist

## 2016-05-27 DIAGNOSIS — I5022 Chronic systolic (congestive) heart failure: Secondary | ICD-10-CM | POA: Diagnosis not present

## 2016-05-27 NOTE — Assessment & Plan Note (Signed)
Will have patient repeat BMET this afternoon.  As long as potassium has not gone up any I will increase the Entresto to 97/103.  He is to continue with home BP monitoring and his daughter knows to call should his pressure be more consistently above 140-150.

## 2016-05-27 NOTE — Patient Instructions (Addendum)
  Your blood pressure today is 144/68 (goal is < 140/90)  Check your blood pressure at home daily (if able) and keep record of the readings.  Take your BP meds as follows:  After I get the results of your labs (tomorrow), we will call Marc Hicks with new dosing instructions.    Bring all of your meds, your BP cuff and your record of home blood pressures to your next appointment.  Exercise as you're able, try to walk approximately 30 minutes per day.  Keep salt intake to a minimum, especially watch canned and prepared boxed foods.  Eat more fresh fruits and vegetables and fewer canned items.  Avoid eating in fast food restaurants.    HOW TO TAKE YOUR BLOOD PRESSURE: . Rest 5 minutes before taking your blood pressure. .  Don't smoke or drink caffeinated beverages for at least 30 minutes before. . Take your blood pressure before (not after) you eat. . Sit comfortably with your back supported and both feet on the floor (don't cross your legs). . Elevate your arm to heart level on a table or a desk. . Use the proper sized cuff. It should fit smoothly and snugly around your bare upper arm. There should be enough room to slip a fingertip under the cuff. The bottom edge of the cuff should be 1 inch above the crease of the elbow. . Ideally, take 3 measurements at one sitting and record the average.
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
## 2016-05-28 LAB — BASIC METABOLIC PANEL
BUN: 19 mg/dL (ref 7–25)
CHLORIDE: 100 mmol/L (ref 98–110)
CO2: 24 mmol/L (ref 20–31)
Calcium: 9.1 mg/dL (ref 8.6–10.3)
Creat: 0.96 mg/dL (ref 0.70–1.18)
GLUCOSE: 344 mg/dL — AB (ref 65–99)
POTASSIUM: 4.8 mmol/L (ref 3.5–5.3)
Sodium: 134 mmol/L — ABNORMAL LOW (ref 135–146)

## 2016-06-02 ENCOUNTER — Telehealth: Payer: Self-pay | Admitting: Cardiology

## 2016-06-02 MED ORDER — SACUBITRIL-VALSARTAN 49-51 MG PO TABS: 1.0000 | ORAL_TABLET | Freq: Two times a day (BID) | ORAL | 1 refills | Status: DC

## 2016-06-02 MED ORDER — SACUBITRIL-VALSARTAN 97-103 MG PO TABS: 1.0000 | ORAL_TABLET | Freq: Two times a day (BID) | ORAL | 6 refills | Status: DC

## 2016-06-02 NOTE — Telephone Encounter (Signed)
Pt daughter is calling to see if her dads potassium level was fine for him to start taking entresto. Had samples to try and now has nothing left. Pharmacy in [place]

## 2016-06-02 NOTE — Telephone Encounter (Signed)
LMTCB-need to know medication to refill

## 2016-06-02 NOTE — Telephone Encounter (Signed)
New message  Pt daughter call requesting to speak with RN about pts medication. Pt daughter states pt is out of his medication. Pt daughter states she spoke with RN about 5 days ago, but has not received a call back. Pt daughter does not know the name of the medication. Please call back to discuss

## 2016-06-02 NOTE — Telephone Encounter (Signed)
Patient okay to start the 97/103 mg dose today, despite being without meds x 3 days.  Daughter has card for free $30 day supply, was told that future copay would be $45/month, not sure if this will be cost prohibitive to her father or not.  She knows to call if it is a problem.

## 2016-06-02 NOTE — Telephone Encounter (Signed)
Spoke to daughter and relayed BMET results. She informs me the patient has been out of his Entresto since Sunday and has not taken since then - [facility]. They had also been waiting on the BMET results to make sure potassium was OK - I believe they interpreted this instruction to mean that if they hadn't heard anything, not to take the medication. Informed her [facility] but that I was not sure what we should do to restart him, will route to CalamusKristin for advice. Also unclear as to whether they've gone through process of patient assistance application. Please advise.

## 2016-06-15 ENCOUNTER — Telehealth: Payer: Self-pay | Admitting: Cardiology

## 2016-06-15 NOTE — Telephone Encounter (Signed)
Left message for pt dtr to call  

## 2016-06-15 NOTE — Telephone Encounter (Signed)
Marc FanningJulie Landscape architect( daugther) is calling about the Sherryll Burgerntresto , and it is making him feel bad and he stopped taking it on Friday . Wants to know what else can he take  . Please call   Thanks

## 2016-06-15 NOTE — Telephone Encounter (Signed)
Have him go back on lisinopril 20 mg bid.  He will need to continue with home BP monitoring and let us know if his BP rises to 140 or greater systolic.

## 2016-06-16 MED ORDER — VALSARTAN 320 MG PO TABS: 320.0000 mg | ORAL_TABLET | Freq: Every day | ORAL | 5 refills | Status: DC

## 2016-06-16 NOTE — Telephone Encounter (Signed)
Spoke with Raynelle FanningJulie ( daugther) she states that she seems to remember that pt had a cough on Lisinopril, can we do another medication? Please advise

## 2016-06-16 NOTE — Telephone Encounter (Signed)
Have him start valsartan 320 mg once daily.  Continue with home BP checks.

## 2016-06-16 NOTE — Addendum Note (Signed)
Addended by: Alyson InglesBROOME, Sheniya Garciaperez L on: 06/16/2016 02:24 PM   Modules accepted: Orders

## 2016-06-16 NOTE — Telephone Encounter (Signed)
Spoke with Raynelle FanningJulie ( daugther) notified, please send to [facility] [place] rx sent to pharmacy of choice

## 2016-07-13 ENCOUNTER — Ambulatory Visit: Payer: Medicare Other | Admitting: Cardiology

## 2016-09-09 IMAGING — NM NM MISC PROCEDURE
6 series · 36 of 36 positions shown · non-contrast
Comparison: none

[Series 1: wbr_r-proj_st wbr rest · 6.40mm/px · 6 of 64 frames shown]
[frame 6/64]
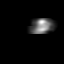
[frame 16/64]
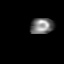
[frame 27/64]
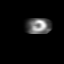
[frame 38/64]
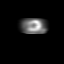
[frame 48/64]
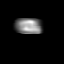
[frame 59/64]
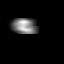

[Series 1: wbr rest · 6.40mm/px · 6 of 64 frames shown]
[frame 6/64]
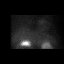
[frame 16/64]
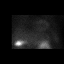
[frame 27/64]
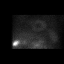
[frame 38/64]
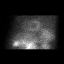
[frame 48/64]
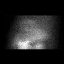
[frame 59/64]
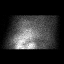

[Series 2: wbr_s-proj_st wbr stress-gsp · 6.40mm/px · 6 of 512 frames shown]
[frame 43/512]
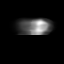
[frame 128/512]
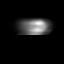
[frame 214/512]
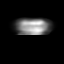
[frame 299/512]
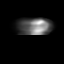
[frame 384/512]
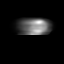
[frame 470/512]
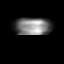

[Series 2: wbr stress-gsp · 6.40mm/px · 6 of 512 frames shown]
[frame 43/512]
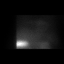
[frame 128/512]
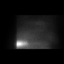
[frame 214/512]
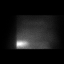
[frame 299/512]
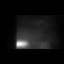
[frame 384/512]
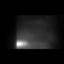
[frame 470/512]
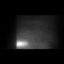

[Series 3: wbr_s-proj_st wbr stress-sum-em · 6.40mm/px · 6 of 64 frames shown]
[frame 6/64]
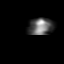
[frame 16/64]
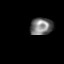
[frame 27/64]
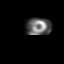
[frame 38/64]
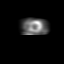
[frame 48/64]
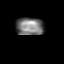
[frame 59/64]
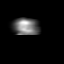

[Series 3: wbr stress-sum-em · 6.40mm/px · 6 of 64 frames shown]
[frame 6/64]
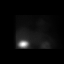
[frame 16/64]
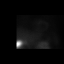
[frame 27/64]
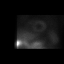
[frame 38/64]
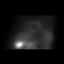
[frame 48/64]
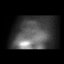
[frame 59/64]
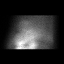

[36 of 36 positions shown; findings below may reference images not displayed]

Canned report from images found in remote index.

Refer to host system for actual result text.

## 2016-10-11 ENCOUNTER — Telehealth: Payer: Self-pay | Admitting: Cardiology

## 2016-10-11 ENCOUNTER — Other Ambulatory Visit: Payer: Self-pay

## 2016-10-11 NOTE — Telephone Encounter (Signed)
This change is OK as long as BP is controlled. Same class of medication.

## 2016-10-11 NOTE — Telephone Encounter (Signed)
SENT TO DR  SwazilandJORDAN FOR  REVIEW  .Zack Seal/CY

## 2016-10-11 NOTE — Telephone Encounter (Signed)
Returned call to patient's daughter Amil AmenJulia no answer.LMTC.

## 2016-10-11 NOTE — Telephone Encounter (Signed)
Received a call from patient's daughter Amil AmenJulia Dr.Jordan's advice given.

## 2016-10-11 NOTE — Telephone Encounter (Signed)
Julia(daughter) is calling because Mr.Marc Hicks went to get his medication(Valsartan 320 mg )  from the [facility] and they didn't have what Dr.jordan prescribed and gave him a different medication ( Losartan 100mg  ) . She is wanting to know will that ok for him to take the Losartan . Please call

## 2019-08-27 DEATH — deceased
